# Patient Record
Sex: Female | Born: 1955
Health system: Southern US, Community
[De-identification: ages and names within clinical notes are randomized; demographics above are authoritative.]

## PROBLEM LIST (undated history)

## (undated) DIAGNOSIS — E785 Hyperlipidemia, unspecified: Secondary | ICD-10-CM

## (undated) HISTORY — PX: LAPAROSCOPIC TUBAL LIGATION: SUR803

## (undated) HISTORY — DX: Hyperlipidemia, unspecified: E78.5

## (undated) HISTORY — PX: HERNIA REPAIR: SHX51

---

## 1998-05-29 ENCOUNTER — Other Ambulatory Visit: Admission: RE | Admit: 1998-05-29 | Discharge: 1998-05-29 | Payer: Self-pay | Admitting: Obstetrics and Gynecology

## 1998-12-11 ENCOUNTER — Ambulatory Visit (HOSPITAL_COMMUNITY): Admission: RE | Admit: 1998-12-11 | Discharge: 1998-12-11 | Payer: Self-pay | Admitting: *Deleted

## 1999-10-21 ENCOUNTER — Other Ambulatory Visit: Admission: RE | Admit: 1999-10-21 | Discharge: 1999-10-21 | Payer: Self-pay | Admitting: Obstetrics and Gynecology

## 2000-12-15 ENCOUNTER — Other Ambulatory Visit: Admission: RE | Admit: 2000-12-15 | Discharge: 2000-12-15 | Payer: Self-pay | Admitting: Obstetrics and Gynecology

## 2002-02-08 ENCOUNTER — Other Ambulatory Visit: Admission: RE | Admit: 2002-02-08 | Discharge: 2002-02-08 | Payer: Self-pay | Admitting: Obstetrics and Gynecology

## 2003-03-29 ENCOUNTER — Other Ambulatory Visit: Admission: RE | Admit: 2003-03-29 | Discharge: 2003-03-29 | Payer: Self-pay | Admitting: Obstetrics and Gynecology

## 2004-03-27 ENCOUNTER — Other Ambulatory Visit: Admission: RE | Admit: 2004-03-27 | Discharge: 2004-03-27 | Payer: Self-pay | Admitting: Obstetrics and Gynecology

## 2005-04-01 ENCOUNTER — Other Ambulatory Visit: Admission: RE | Admit: 2005-04-01 | Discharge: 2005-04-01 | Payer: Self-pay | Admitting: Obstetrics and Gynecology

## 2006-07-02 ENCOUNTER — Ambulatory Visit (HOSPITAL_COMMUNITY): Admission: RE | Admit: 2006-07-02 | Discharge: 2006-07-02 | Payer: Self-pay | Admitting: Family Medicine

## 2008-08-08 ENCOUNTER — Emergency Department (HOSPITAL_COMMUNITY): Admission: EM | Admit: 2008-08-08 | Discharge: 2008-08-08 | Payer: Self-pay | Admitting: Emergency Medicine

## 2012-11-23 ENCOUNTER — Ambulatory Visit (INDEPENDENT_AMBULATORY_CARE_PROVIDER_SITE_OTHER): Payer: Commercial Managed Care - PPO | Admitting: General Surgery

## 2012-11-23 ENCOUNTER — Encounter (INDEPENDENT_AMBULATORY_CARE_PROVIDER_SITE_OTHER): Payer: Self-pay | Admitting: General Surgery

## 2012-11-23 VITALS — BP 118/68 | HR 64 | Temp 97.9°F | Resp 14 | Ht 65.0 in | Wt 136.4 lb

## 2012-11-23 DIAGNOSIS — K648 Other hemorrhoids: Secondary | ICD-10-CM | POA: Insufficient documentation

## 2012-11-23 MED ORDER — HYDROCORTISONE ACETATE 25 MG RE SUPP: 25.0000 mg | Freq: Two times a day (BID) | RECTAL | Status: DC

## 2012-11-23 MED ORDER — HYDROCORTISONE ACETATE 25 MG RE SUPP: 25.0000 mg | Freq: Every day | RECTAL | Status: DC

## 2012-11-23 NOTE — Patient Instructions (Signed)
HEMORRHOIDS    Did you know... Hemorrhoids are one of the most common ailments known.  More than half the population will develop hemorrhoids, usually after age 55.  Millions of Americans currently suffer from hemorrhoids.  The average person suffers in silence for a long period before seeking medical care.  Today's treatment methods make some types of hemorrhoid removal much less painful.  What are hemorrhoids? Often described as "varicose veins of the anus and rectum", hemorrhoids are enlarged, bulging blood vessels in and about the anus and lower rectum. There are two types of hemorrhoids: external and internal, which refer to their location.  External (outside) hemorrhoids develop near the anus and are covered by very sensitive skin. These are usually painless. However, if a blood clot (thrombosis) develops in an external hemorrhoid, it becomes a painful, hard lump. The external hemorrhoid may bleed if it ruptures. Internal (inside) hemorrhoids develop within the anus beneath the lining. Painless bleeding and protrusion during bowel movements are the most common symptom. However, an internal hemorrhoid can cause severe pain if it is completely "prolapsed" - protrudes from the anal opening and cannot be pushed back inside.   What causes hemorrhoids? An exact cause is unknown; however, the upright posture of humans alone forces a great deal of pressure on the rectal veins, which sometimes causes them to bulge. Other contributing factors include:  . Aging  . Chronic constipation or diarrhea  . Pregnancy  . Heredity  . Straining during bowel movements  . Faulty bowel function due to overuse of laxatives or enemas . Spending long periods of time (e.g., reading) on the toilet  Whatever the cause, the tissues supporting the vessels stretch. As a result, the vessels dilate; their walls become thin and bleed. If the stretching and pressure continue, the weakened vessels protrude.  What are the  symptoms? If you notice any of the following, you could have hemorrhoids:  . Bleeding during bowel movements  . Protrusion during bowel movements . Itching in the anal area  . Pain  . Sensitive lump(s)  How are hemorrhoids treated? Mild symptoms can be relieved frequently by increasing the amount of fiber (e.g., fruits, vegetables, breads and cereals) and fluids in the diet. Eliminating excessive straining reduces the pressure on hemorrhoids and helps prevent them from protruding. A sitz bath - sitting in plain warm water for about 10 minutes - can also provide some relief . With these measures, the pain and swelling of most symptomatic hemorrhoids will decrease in two to seven days, and the firm lump should recede within four to six weeks. In cases of severe or persistent pain from a thrombosed hemorrhoid, your physician may elect to remove the hemorrhoid containing the clot with a small incision. Performed under local anesthesia as an outpatient, this procedure generally provides relief. Severe hemorrhoids may require special treatment, much of which can be performed on an outpatient basis.  . Ligation - the rubber band treatment - works effectively on internal hemorrhoids that protrude with bowel movements. A small rubber band is placed over the hemorrhoid, cutting off its blood supply. The hemorrhoid and the band fall off in a few days and the wound usually heals in a week or two. This procedure sometimes produces mild discomfort and bleeding and may need to be repeated for a full effect.  There is a more intense version of this procedure that is done in the OR as outpatient surgery called THD.  It involves identifying blood vessels leading to the   hemorrhoids and then tying them off with sutures.  This method is a little more painful than rubber band ligation but less painful than traditional hemorrhoidectomy and usually does not have to be repeated.  It is best for internal hemorrhoids that  bleed.  Rubber Band Ligation of Internal Hemorrhoids:  A.  Bulging, bleeding, internal hemorrhoid B.  Rubber band applied at the base of the hemorrhoid C.  About 7 days later, the banded hemorrhoid has fallen off leaving a small scar (arrow)  . Injection and Coagulation can also be used on bleeding hemorrhoids that do not protrude. Both methods are relatively painless and cause the hemorrhoid to shrivel up. . Hemorrhoidectomy - surgery to remove the hemorrhoids - is the most complete method for removal of internal and external hemorrhoids. It is necessary when (1) clots repeatedly form in external hemorrhoids; (2) ligation fails to treat internal hemorrhoids; (3) the protruding hemorrhoid cannot be reduced; or (4) there is persistent bleeding. A hemorrhoidectomy removes excessive tissue that causes the bleeding and protrusion. It is done under anesthesia using sutures, and may, depending upon circumstances, require hospitalization and a period of inactivity. Laser hemorrhoidectomies do not offer any advantage over standard operative techniques. They are also quite expensive, and contrary to popular belief, are no less painful.  Do hemorrhoids lead to cancer? No. There is no relationship between hemorrhoids and cancer. However, the symptoms of hemorrhoids, particularly bleeding, are similar to those of colorectal cancer and other diseases of the digestive system. Therefore, it is important that all symptoms are investigated by a physician specially trained in treating diseases of the colon and rectum and that everyone 50 years or older undergo screening tests for colorectal cancer. Do not rely on over-the-counter medications or other self-treatments. See a colorectal surgeon first so your symptoms can be properly evaluated and effective treatment prescribed.  2012 American Society of Colon & Rectal Surgeons    Fiber Chart  You should 25-30g of fiber per day and drinking 8 glasses of water to help  your bowels move regularly.  In the chart below you can look up how much fiber you are getting in an average day.  If you are not getting enough fiber, you should add a fiber supplement to your diet.  Examples of this include Metamucil, FiberCon and Citrucel.  These can be purchased at your local grocery store or pharmacy.      http://www.[employer].edu/offices/health/nutritioncoach/AtoZ/handouts/Fiber.pdf    GETTING TO GOOD BOWEL HEALTH. Irregular bowel habits such as constipation can lead to many problems over time.  Having one soft bowel movement a day is the most important way to prevent further problems.  The anorectal canal is designed to handle stretching and feces to safely manage our ability to get rid of solid waste (feces, poop, stool) out of our body.  BUT, hard constipated stools can act like ripping concrete bricks causing inflamed hemorrhoids, anal fissures, abdominal pain and bloating.     The goal: ONE SOFT BOWEL MOVEMENT A DAY!  To have soft, regular bowel movements:    Drink at least 8 tall glasses of water a day.     Take plenty of fiber.  Fiber is the undigested part of plant food that passes into the colon, acting s "natures broom" to encourage bowel motility and movement.  Fiber can absorb and hold large amounts of water. This results in a larger, bulkier stool, which is soft and easier to pass. Work gradually over several weeks up to 6 servings a   day of fiber (25g a day even more if needed) in the form of: o Vegetables -- Root (potatoes, carrots, turnips), leafy green (lettuce, salad greens, celery, spinach), or cooked high residue (cabbage, broccoli, etc) o Fruit -- Fresh (unpeeled skin & pulp), Dried (prunes, apricots, cherries, etc ),  or stewed ( applesauce)  o Whole grain breads, pasta, etc (whole wheat)  o Bran cereals    Bulking Agents -- This type of water-retaining fiber generally is easily obtained each day by one of the following:  o Psyllium bran -- The psyllium  plant is remarkable because its ground seeds can retain so much water. This product is available as Metamucil, Konsyl, Effersyllium, Per Diem Fiber, or the less expensive generic preparation in drug and health food stores. Although labeled a laxative, it really is not a laxative.  o Methylcellulose -- This is another fiber derived from wood which also retains water. It is available as Citrucel. o Polyethylene Glycol - and "artificial" fiber commonly called Miralax or Glycolax.  It is helpful for people with gassy or bloated feelings with regular fiber o Flax Seed - a less gassy fiber than psyllium   No reading or other relaxing activity while on the toilet. If bowel movements take longer than 5 minutes, you are too constipated.   AVOID CONSTIPATION.  High fiber and water intake usually takes care of this.  Sometimes a laxative is needed to stimulate more frequent bowel movements, but    Laxatives are not a good long-term solution as it can wear the colon out. o Osmotics (Milk of Magnesia, Fleets phosphosoda, Magnesium citrate, MiraLax, GoLytely) are safer than  o Stimulants (Senokot, Castor Oil, Dulcolax, Ex Lax)    o Do not take laxatives for more than 7days in a row.    IF SEVERELY CONSTIPATED, try a Bowel Retraining Program: o Do not use laxatives.  o Eat a diet high in roughage, such as bran cereals and leafy vegetables.  o Drink six (6) ounces of prune or apricot juice each morning.  o Eat two (2) large servings of stewed fruit each day.  o Take one (1) heaping tablespoon of a psyllium-based bulking agent twice a day. Use sugar-free sweetener when possible to avoid excessive calories.  o Eat a normal breakfast.  o Set aside 15 minutes after breakfast to sit on the toilet, but do not strain to have a bowel movement.  o If you do not have a bowel movement by the third day, use an enema and repeat the above steps.    

## 2012-11-23 NOTE — Progress Notes (Signed)
Chief Complaint  Patient presents with  . New Evaluation    eval hems    HISTORY: Diamond Tucker is a 57 y.o. female who presents to the office with rectal bleeding about once a week.  Other symptoms include inability to clean the area.  This had been occurring for 6 months.  She has tried hemorrhoid creams in the past with little success.  Nothing makes the symptoms worse.   It is intermittent in nature.  Her bowel habits are regular and her bowel movements are soft.  She denies straining.  Her fiber intake is dietary.  Her last colonoscopy was about 3 yrs ago with Dr Ewing Schlein and was normal per pt.  She feels she has prolapsing tissue.   She did have a 4th degree tear with her last childbirth (9yrs ago).  Past Medical History  Diagnosis Date  . Hyperlipidemia       Past Surgical History  Procedure Laterality Date  . Hernia repair    . Laparoscopic tubal ligation          Current Outpatient Prescriptions  Medication Sig Dispense Refill  . lactobacillus acidophilus (BACID) TABS tablet Take 2 tablets by mouth 3 (three) times daily.      . Melatonin 3 MG TABS Take by mouth.      . Multiple Vitamins-Minerals (MULTIVITAMIN PO) Take by mouth.      . hydrocortisone (ANUSOL-HC) 25 MG suppository Place 1 suppository (25 mg total) rectally 2 (two) times daily.  12 suppository  0   No current facility-administered medications for this visit.      Not on File    History reviewed. No pertinent family history.  History   Social History  . Marital Status: Married    Spouse Name: N/A    Number of Children: N/A  . Years of Education: N/A   Social History Main Topics  . Smoking status: Never Smoker   . Smokeless tobacco: Never Used  . Alcohol Use: Yes     Comment: social/occ  . Drug Use: No  . Sexual Activity: None   Other Topics Concern  . None   Social History Narrative  . None      REVIEW OF SYSTEMS - PERTINENT POSITIVES ONLY: Review of Systems - General ROS: negative for -  chills, fever or weight loss Hematological and Lymphatic ROS: negative for - bleeding problems, blood clots or bruising Respiratory ROS: no cough, shortness of breath, or wheezing Cardiovascular ROS: no chest pain or dyspnea on exertion Gastrointestinal ROS: negative for - abdominal pain or diarrhea Genito-Urinary ROS: no dysuria, trouble voiding, or hematuria  EXAM: Filed Vitals:   11/23/12 1619  BP: 118/68  Pulse: 64  Temp: 97.9 F (36.6 C)  Resp: 14    General appearance: alert and cooperative Resp: clear to auscultation bilaterally Cardio: regular rate and rhythm GI: normal findings: soft, non-tender   Procedure: Anoscopy Surgeon: Maisie Fus Diagnosis: rectal bleeding  Assistant: Christella Scheuermann After the risks and benefits were explained, verbal consent was obtained for above procedure  Anesthesia: none Findings: good resting tone and squeeze pressure.  R ant internal and external hemorrhoid- grade 3, hard stool in rectal vault    ASSESSMENT AND PLAN: Diamond Tucker is a 57 y.o. F with recurrent rectal bleeding.  On exam she has a mildly inflamed grade 3 hemorrhoid.  I have asked her to start a fiber supplement daily and try some anusol suppositories at night for 7-10 days.  I will see her  back in 2 months.  She would be a good candidate for banding in the future, if she is still having problems then.      Vanita Panda, MD Colon and Rectal Surgery / General Surgery [facility]      Visit Diagnoses: 1. Internal bleeding hemorrhoids     Primary Care Physician: Thora Lance, MD

## 2013-01-26 ENCOUNTER — Encounter (INDEPENDENT_AMBULATORY_CARE_PROVIDER_SITE_OTHER): Payer: Commercial Managed Care - PPO | Admitting: General Surgery

## 2013-03-15 ENCOUNTER — Ambulatory Visit (INDEPENDENT_AMBULATORY_CARE_PROVIDER_SITE_OTHER): Payer: Commercial Managed Care - PPO | Admitting: General Surgery

## 2013-03-15 ENCOUNTER — Encounter (INDEPENDENT_AMBULATORY_CARE_PROVIDER_SITE_OTHER): Payer: Self-pay | Admitting: General Surgery

## 2013-03-15 VITALS — BP 122/70 | HR 72 | Ht 65.0 in | Wt 141.5 lb

## 2013-03-15 DIAGNOSIS — K642 Third degree hemorrhoids: Secondary | ICD-10-CM

## 2013-03-15 DIAGNOSIS — K648 Other hemorrhoids: Secondary | ICD-10-CM

## 2013-03-15 NOTE — Patient Instructions (Signed)
We will set up an apt to band your hemorrhoids.  Call the office if you change your mind or have any questions.

## 2013-03-15 NOTE — Progress Notes (Signed)
Diamond Tucker is a 58 y.o. female who is here for a follow up visit regarding her hemorrhoid disease.  SHe is having less symptoms but is still having issues every other week.  Objective: Filed Vitals:   03/15/13 1556  BP: 122/70  Pulse: 72    General appearance: alert and cooperative GI: normal findings: soft, non-tender Anal: moderate R A skin tag with grade 3 int hemorrhoid   Assessment and Plan: We discussed all of her options. These include continuing with medical therapy, banding in the office, hemorrhoidal pexy in the OR or hemorrhoid resection in the OR. We discussed that the recurrence rate is less than a more aggressive you are. She would like to start with a less aggressive treatment. We will set her up for banding in the office.  I discussed with her that she may continue to have issues after the banding in the may take a couple sessions.   Rosario Adie, Bow Mar Surgery, [place]

## 2013-04-10 ENCOUNTER — Ambulatory Visit (INDEPENDENT_AMBULATORY_CARE_PROVIDER_SITE_OTHER): Payer: Commercial Managed Care - PPO | Admitting: General Surgery

## 2013-04-10 ENCOUNTER — Encounter (INDEPENDENT_AMBULATORY_CARE_PROVIDER_SITE_OTHER): Payer: Self-pay | Admitting: General Surgery

## 2013-04-10 VITALS — BP 102/70 | HR 72 | Temp 98.4°F | Resp 12 | Ht 65.0 in | Wt 141.4 lb

## 2013-04-10 DIAGNOSIS — K648 Other hemorrhoids: Secondary | ICD-10-CM

## 2013-04-10 NOTE — Patient Instructions (Signed)
Patient Information following hemorrhoid banding  Hemorrhoid banding is a procedure that places a small rubber band around a hemorrhoid, causing it to clot and then break off and be passed in the stool.  This is a minor procedure, but problems may develop in rare cases.  The following are warning symptoms and signs that should alert you to a possible complication.  Please contact the office if any of these should occur:   Increased pain with bowel movements or sitting  Temperature over 100.4 F (oral)  Bleeding that is excessive (over one cup of clots or blood)  Redness or irritation outside the anus  Difficulty urinating  For your comfort please follow these instructions:   Maintain a high fiber diet so that your bowel movements will be soft  Take a fiber supplement twice a day (such as Metamucil, Benefiber, Citracel or Fibercon)  Sit in a tub of warm water 2-3 times a day for the first 2-3 days, as needed to soothe the area.  You may expect some pressure sensations in the anal area for 1-2 days  If you need pain medication, take Tylenol not aspirin or Ibuprofen products.  In 7-10 days, the banded tissue and rubber band will pass with your stool.  Occasionally there is some bleeding after this.  If this bleeding seems excessive, call the office immediately or go to the Emergency Room.    Make an appointment to see me in 2-3 weeks after the procedure 

## 2013-04-10 NOTE — Progress Notes (Signed)
Diamond Tucker is a 58 y.o. female who is here for a follow up visit regarding her hemorrhoid disease.  She is using a fiber supplement daily and Anusol suppositories as needed. She continues to have intermittent bleeding. She has a known grade 3 internal hemorrhoid. She is here today to discuss banding.  She denies any changes in her symptoms. She is having regular bowel movements.  Objective: Filed Vitals:   04/10/13 1042  BP: 102/70  Pulse: 72  Temp: 98.4 F (36.9 C)  Resp: 12    General appearance: alert and cooperative Resp: clear to auscultation bilaterally Cardio: regular rate and rhythm GI: normal findings: soft, non-tender  Procedure: Hemorrhoid banding ligation  Surgeon: Marcello Moores Assistant: Glaspey After the risks and benefits were explained, verbal consent was obtained for above procedure  Anesthesia: none Diagnosis: Bleeding, prolapsed internal hemorrhoid  The anatomy & physiology of the anorectal region was discussed.  The pathophysiology of hemorrhoids and differential diagnosis was discussed.  Natural history progression  was discussed.   I stressed the importance of a bowel regimen to have daily soft bowel movements to minimize progression of disease.     The patient's symptoms are not adequately controlled.  Therefore, I recommended banding to treat the hemorrhoids.  I went over the technique, risks, benefits, and alternatives.   Goals of post-operative recovery were discussed as well.  Questions were answered.  The patient expressed understanding & wished to proceed.  The patient was positioned in the prone position on the proctology table.  Perianal & rectal examination was done.  Using anoscopy, I ligated the hemorrhoids above the dentate line with banding in the anterior and R anterior positions.  The patient tolerated the procedure well.  Educational handouts further explaining the pathology, treatment options, and bowel regimen were given as well.   Assessment and  Plan: Diamond Tucker Is a 58 year old female who who is well known to me for a prolapsed internal hemorrhoid. She has done her fiber supplements and hemorrhoid creams and still continues to have intermittent bleeding. I have performed a banding in the office. I will see her back in 4 weeks for reevaluation and possible banding again if needed.    Rosario Adie, MD [facility], [place]

## 2013-05-15 ENCOUNTER — Encounter (INDEPENDENT_AMBULATORY_CARE_PROVIDER_SITE_OTHER): Payer: Self-pay | Admitting: General Surgery

## 2013-05-15 ENCOUNTER — Ambulatory Visit (INDEPENDENT_AMBULATORY_CARE_PROVIDER_SITE_OTHER): Payer: Commercial Managed Care - PPO | Admitting: General Surgery

## 2013-05-15 VITALS — BP 92/60 | HR 80 | Temp 98.2°F | Resp 16 | Ht 65.0 in | Wt 143.2 lb

## 2013-05-15 DIAGNOSIS — K648 Other hemorrhoids: Secondary | ICD-10-CM

## 2013-05-15 NOTE — Patient Instructions (Signed)
Return to the office in 6 weeks 

## 2013-05-15 NOTE — Progress Notes (Signed)
Diamond Tucker is a 58 y.o. female who is here for a follow up visit regarding Her bleeding internal hemorrhoids. She reports one episode of bleeding with bowel movements. She also has some occasional blood on the toilet paper. She had stone well with her banding so far.  Objective: Filed Vitals:   05/15/13 1625  BP: 92/60  Pulse: 80  Temp: 98.2 F (36.8 C)  Resp: 16    General appearance: alert and cooperative GI: normal findings: soft, non-tender perianal: Small skin tags noted and right anterior and left lateral positions. No signs of inflammation. Digital rectal exam reveals a healing areas of previous banding.  Assessment and Plan: I have recommended that she continue her high fiber diet and monitor these over the next 6 weeks. If she continues to have bleeding that does not get better over this time period, I think it would be reasonable to repeat the banding in the office.    Rosario Adie, MD [facility], [place]

## 2013-06-26 ENCOUNTER — Encounter (INDEPENDENT_AMBULATORY_CARE_PROVIDER_SITE_OTHER): Payer: Commercial Managed Care - PPO | Admitting: General Surgery

## 2013-06-29 ENCOUNTER — Encounter (INDEPENDENT_AMBULATORY_CARE_PROVIDER_SITE_OTHER): Payer: Commercial Managed Care - PPO | Admitting: General Surgery

## 2013-08-02 ENCOUNTER — Ambulatory Visit: Payer: Self-pay

## 2013-08-02 ENCOUNTER — Other Ambulatory Visit: Payer: Self-pay | Admitting: Occupational Medicine

## 2013-08-02 DIAGNOSIS — M25562 Pain in left knee: Secondary | ICD-10-CM

## 2013-08-16 ENCOUNTER — Encounter (INDEPENDENT_AMBULATORY_CARE_PROVIDER_SITE_OTHER): Payer: Commercial Managed Care - PPO | Admitting: General Surgery

## 2015-04-04 DIAGNOSIS — E7800 Pure hypercholesterolemia, unspecified: Secondary | ICD-10-CM | POA: Diagnosis not present

## 2015-04-04 DIAGNOSIS — Z0000 Encounter for general adult medical examination without abnormal findings: Secondary | ICD-10-CM | POA: Diagnosis not present

## 2015-04-04 MED FILL — FENOFIBRATE 160 MG TABLET: 160 | 90 days supply | Qty: 90 | Fill #1

## 2015-04-18 DIAGNOSIS — L814 Other melanin hyperpigmentation: Secondary | ICD-10-CM | POA: Diagnosis not present

## 2015-04-18 DIAGNOSIS — D2261 Melanocytic nevi of right upper limb, including shoulder: Secondary | ICD-10-CM | POA: Diagnosis not present

## 2015-04-18 DIAGNOSIS — D2272 Melanocytic nevi of left lower limb, including hip: Secondary | ICD-10-CM | POA: Diagnosis not present

## 2015-04-18 DIAGNOSIS — D225 Melanocytic nevi of trunk: Secondary | ICD-10-CM | POA: Diagnosis not present

## 2015-04-18 DIAGNOSIS — D2262 Melanocytic nevi of left upper limb, including shoulder: Secondary | ICD-10-CM | POA: Diagnosis not present

## 2015-07-08 MED FILL — FENOFIBRATE 160 MG TABLET: 160 | 90 days supply | Qty: 90 | Fill #0

## 2015-10-11 DIAGNOSIS — Z1231 Encounter for screening mammogram for malignant neoplasm of breast: Secondary | ICD-10-CM | POA: Diagnosis not present

## 2015-10-11 DIAGNOSIS — Z01419 Encounter for gynecological examination (general) (routine) without abnormal findings: Secondary | ICD-10-CM | POA: Diagnosis not present

## 2015-10-11 DIAGNOSIS — Z6822 Body mass index (BMI) 22.0-22.9, adult: Secondary | ICD-10-CM | POA: Diagnosis not present

## 2015-10-11 DIAGNOSIS — Z124 Encounter for screening for malignant neoplasm of cervix: Secondary | ICD-10-CM | POA: Diagnosis not present

## 2015-10-16 MED FILL — FENOFIBRATE 160 MG TABLET: 160 | 90 days supply | Qty: 90 | Fill #1

## 2015-10-29 DIAGNOSIS — H524 Presbyopia: Secondary | ICD-10-CM | POA: Diagnosis not present

## 2015-10-29 DIAGNOSIS — H35371 Puckering of macula, right eye: Secondary | ICD-10-CM | POA: Diagnosis not present

## 2015-12-20 MED FILL — ALPRAZolam 0.5 MG TABS: 0.5 | 10 days supply | Qty: 30 | Fill #0

## 2016-01-20 MED FILL — FENOFIBRATE 160 MG TABLET: 160 | 90 days supply | Qty: 90 | Fill #2

## 2016-02-04 DIAGNOSIS — H35371 Puckering of macula, right eye: Secondary | ICD-10-CM | POA: Diagnosis not present

## 2016-02-18 DIAGNOSIS — L821 Other seborrheic keratosis: Secondary | ICD-10-CM | POA: Diagnosis not present

## 2016-02-18 DIAGNOSIS — L820 Inflamed seborrheic keratosis: Secondary | ICD-10-CM | POA: Diagnosis not present

## 2016-04-22 MED FILL — FENOFIBRATE 160 MG TABLET: 160 | 90 days supply | Qty: 90 | Fill #0

## 2016-06-24 DIAGNOSIS — D485 Neoplasm of uncertain behavior of skin: Secondary | ICD-10-CM | POA: Diagnosis not present

## 2016-06-24 DIAGNOSIS — D225 Melanocytic nevi of trunk: Secondary | ICD-10-CM | POA: Diagnosis not present

## 2016-06-24 DIAGNOSIS — Z0000 Encounter for general adult medical examination without abnormal findings: Secondary | ICD-10-CM | POA: Diagnosis not present

## 2016-06-24 DIAGNOSIS — E7800 Pure hypercholesterolemia, unspecified: Secondary | ICD-10-CM | POA: Diagnosis not present

## 2016-07-17 MED FILL — ALPRAZolam 0.5 MG TABS: 0.5 | 10 days supply | Qty: 30 | Fill #0

## 2016-07-20 MED FILL — FENOFIBRATE 160 MG TABLET: 160 | 90 days supply | Qty: 90 | Fill #0

## 2016-10-22 MED FILL — FENOFIBRATE 160 MG TABLET: 160 | 90 days supply | Qty: 90 | Fill #1

## 2016-10-26 DIAGNOSIS — Z1231 Encounter for screening mammogram for malignant neoplasm of breast: Secondary | ICD-10-CM | POA: Diagnosis not present

## 2016-10-26 DIAGNOSIS — Z124 Encounter for screening for malignant neoplasm of cervix: Secondary | ICD-10-CM | POA: Diagnosis not present

## 2016-10-26 DIAGNOSIS — Z01419 Encounter for gynecological examination (general) (routine) without abnormal findings: Secondary | ICD-10-CM | POA: Diagnosis not present

## 2016-10-29 DIAGNOSIS — R300 Dysuria: Secondary | ICD-10-CM | POA: Diagnosis not present

## 2016-10-29 MED FILL — CIPROFLOXACIN HCL 500 MG TA: 500 | 3 days supply | Qty: 6 | Fill #0

## 2017-01-19 MED FILL — FENOFIBRATE 160 MG TABLET: 160 | 90 days supply | Qty: 90 | Fill #2

## 2017-03-29 DIAGNOSIS — H35371 Puckering of macula, right eye: Secondary | ICD-10-CM | POA: Diagnosis not present

## 2017-03-29 DIAGNOSIS — H524 Presbyopia: Secondary | ICD-10-CM | POA: Diagnosis not present

## 2017-04-26 MED FILL — FENOFIBRATE 160 MG TABLET: 160 | 90 days supply | Qty: 90 | Fill #3

## 2017-07-20 MED FILL — FENOFIBRATE 160 MG TABLET: 160 | 90 days supply | Qty: 90 | Fill #0

## 2017-09-20 MED FILL — ALPRAZolam 0.5 MG TABS: 0.5 | 10 days supply | Qty: 30 | Fill #0

## 2017-10-08 DIAGNOSIS — Z0000 Encounter for general adult medical examination without abnormal findings: Secondary | ICD-10-CM | POA: Diagnosis not present

## 2017-10-08 DIAGNOSIS — R0981 Nasal congestion: Secondary | ICD-10-CM | POA: Diagnosis not present

## 2017-10-08 DIAGNOSIS — E7800 Pure hypercholesterolemia, unspecified: Secondary | ICD-10-CM | POA: Diagnosis not present

## 2017-10-08 DIAGNOSIS — R748 Abnormal levels of other serum enzymes: Secondary | ICD-10-CM | POA: Diagnosis not present

## 2017-10-08 DIAGNOSIS — Z23 Encounter for immunization: Secondary | ICD-10-CM | POA: Diagnosis not present

## 2017-10-08 MED FILL — AZELASTINE HCL 137 MCG SPRY: 0.1 | 30 days supply | Qty: 30 | Fill #0

## 2017-10-08 MED FILL — FENOFIBRATE 160 MG TABLET: 160 | 90 days supply | Qty: 90 | Fill #0

## 2017-11-19 DIAGNOSIS — Z124 Encounter for screening for malignant neoplasm of cervix: Secondary | ICD-10-CM | POA: Diagnosis not present

## 2017-11-19 DIAGNOSIS — Z01419 Encounter for gynecological examination (general) (routine) without abnormal findings: Secondary | ICD-10-CM | POA: Diagnosis not present

## 2017-11-19 DIAGNOSIS — Z1231 Encounter for screening mammogram for malignant neoplasm of breast: Secondary | ICD-10-CM | POA: Diagnosis not present

## 2017-11-22 ENCOUNTER — Other Ambulatory Visit: Payer: Self-pay | Admitting: Obstetrics and Gynecology

## 2017-11-22 DIAGNOSIS — R928 Other abnormal and inconclusive findings on diagnostic imaging of breast: Secondary | ICD-10-CM

## 2017-11-26 ENCOUNTER — Ambulatory Visit
Admission: RE | Admit: 2017-11-26 | Discharge: 2017-11-26 | Disposition: A | Discharge status: Home / Self Care | Payer: 59 | Source: Ambulatory Visit | Attending: Obstetrics and Gynecology | Admitting: Obstetrics and Gynecology

## 2017-11-26 ENCOUNTER — Other Ambulatory Visit: Payer: Self-pay | Admitting: Obstetrics and Gynecology

## 2017-11-26 DIAGNOSIS — N6489 Other specified disorders of breast: Secondary | ICD-10-CM | POA: Diagnosis not present

## 2017-11-26 DIAGNOSIS — R928 Other abnormal and inconclusive findings on diagnostic imaging of breast: Secondary | ICD-10-CM

## 2017-12-01 ENCOUNTER — Other Ambulatory Visit: Payer: Self-pay | Admitting: Obstetrics and Gynecology

## 2017-12-01 DIAGNOSIS — R922 Inconclusive mammogram: Secondary | ICD-10-CM

## 2017-12-01 DIAGNOSIS — R9230 Dense breasts, unspecified: Secondary | ICD-10-CM

## 2018-01-18 MED FILL — FENOFIBRATE 160 MG TABLET: 160 | 90 days supply | Qty: 90 | Fill #1

## 2018-04-11 DIAGNOSIS — H35371 Puckering of macula, right eye: Secondary | ICD-10-CM | POA: Diagnosis not present

## 2018-04-11 DIAGNOSIS — H524 Presbyopia: Secondary | ICD-10-CM | POA: Diagnosis not present

## 2018-04-18 MED FILL — FENOFIBRATE 160 MG TABLET: 160 | 90 days supply | Qty: 90 | Fill #2

## 2018-05-23 DIAGNOSIS — H35371 Puckering of macula, right eye: Secondary | ICD-10-CM | POA: Diagnosis not present

## 2018-06-02 ENCOUNTER — Other Ambulatory Visit: Payer: Self-pay

## 2018-06-02 ENCOUNTER — Ambulatory Visit
Admission: RE | Admit: 2018-06-02 | Discharge: 2018-06-02 | Disposition: A | Discharge status: Home / Self Care | Payer: 59 | Source: Ambulatory Visit | Attending: Obstetrics and Gynecology | Admitting: Obstetrics and Gynecology

## 2018-06-02 DIAGNOSIS — N6310 Unspecified lump in the right breast, unspecified quadrant: Secondary | ICD-10-CM | POA: Diagnosis not present

## 2018-06-02 DIAGNOSIS — R922 Inconclusive mammogram: Secondary | ICD-10-CM

## 2018-06-03 ENCOUNTER — Other Ambulatory Visit: Payer: Self-pay

## 2018-07-18 MED FILL — FENOFIBRATE 160 MG TABLET: 160 | 90 days supply | Qty: 90 | Fill #0

## 2018-09-23 MED FILL — ALPRAZolam 0.5 MG TABS: 0.5 | 10 days supply | Qty: 30 | Fill #0

## 2018-10-10 DIAGNOSIS — R748 Abnormal levels of other serum enzymes: Secondary | ICD-10-CM | POA: Diagnosis not present

## 2018-10-10 DIAGNOSIS — Z0000 Encounter for general adult medical examination without abnormal findings: Secondary | ICD-10-CM | POA: Diagnosis not present

## 2018-10-10 DIAGNOSIS — E7800 Pure hypercholesterolemia, unspecified: Secondary | ICD-10-CM | POA: Diagnosis not present

## 2018-10-10 MED FILL — FENOFIBRATE 160 MG TABLET: 160 | 90 days supply | Qty: 90 | Fill #0

## 2018-10-13 DIAGNOSIS — R748 Abnormal levels of other serum enzymes: Secondary | ICD-10-CM | POA: Diagnosis not present

## 2018-10-13 DIAGNOSIS — E7800 Pure hypercholesterolemia, unspecified: Secondary | ICD-10-CM | POA: Diagnosis not present

## 2018-11-28 DIAGNOSIS — Z124 Encounter for screening for malignant neoplasm of cervix: Secondary | ICD-10-CM | POA: Diagnosis not present

## 2018-11-28 DIAGNOSIS — Z01419 Encounter for gynecological examination (general) (routine) without abnormal findings: Secondary | ICD-10-CM | POA: Diagnosis not present

## 2018-12-23 DIAGNOSIS — Z1231 Encounter for screening mammogram for malignant neoplasm of breast: Secondary | ICD-10-CM | POA: Diagnosis not present

## 2019-01-17 MED FILL — FENOFIBRATE 160 MG TABLET: 160 | 90 days supply | Qty: 90 | Fill #1

## 2019-04-17 MED FILL — FENOFIBRATE 160 MG TABLET: 160 | 90 days supply | Qty: 90 | Fill #2

## 2019-08-09 DIAGNOSIS — R194 Change in bowel habit: Secondary | ICD-10-CM | POA: Diagnosis not present

## 2019-08-09 DIAGNOSIS — R142 Eructation: Secondary | ICD-10-CM | POA: Diagnosis not present

## 2019-08-09 DIAGNOSIS — R109 Unspecified abdominal pain: Secondary | ICD-10-CM | POA: Diagnosis not present

## 2019-08-09 MED FILL — OSCIMIN SR 0.375 MG TABLET: 0.375 | 30 days supply | Qty: 60 | Fill #0

## 2019-08-09 MED FILL — OMEPRAZOLE DR 20 MG CAPSULE: 20 | 30 days supply | Qty: 30 | Fill #0

## 2019-09-12 MED FILL — ALPRAZolam 0.5 MG TABS: 0.5 | 10 days supply | Qty: 30 | Fill #1

## 2019-10-03 MED FILL — NULYTELY LEMON-LI SOL: 1 days supply | Qty: 4000 | Fill #0

## 2019-10-11 ENCOUNTER — Other Ambulatory Visit (HOSPITAL_COMMUNITY): Payer: Self-pay | Admitting: Family Medicine

## 2019-10-11 MED FILL — FENOFIBRATE 160 MG TABLET: 160 | 90 days supply | Qty: 90 | Fill #0

## 2019-10-24 DIAGNOSIS — K5730 Diverticulosis of large intestine without perforation or abscess without bleeding: Secondary | ICD-10-CM | POA: Diagnosis not present

## 2019-10-24 DIAGNOSIS — D123 Benign neoplasm of transverse colon: Secondary | ICD-10-CM | POA: Diagnosis not present

## 2019-10-24 DIAGNOSIS — Z1211 Encounter for screening for malignant neoplasm of colon: Secondary | ICD-10-CM | POA: Diagnosis not present

## 2019-11-15 ENCOUNTER — Other Ambulatory Visit (HOSPITAL_COMMUNITY): Payer: Self-pay | Admitting: Family Medicine

## 2019-11-15 DIAGNOSIS — Z0000 Encounter for general adult medical examination without abnormal findings: Secondary | ICD-10-CM | POA: Diagnosis not present

## 2019-11-15 DIAGNOSIS — E7800 Pure hypercholesterolemia, unspecified: Secondary | ICD-10-CM | POA: Diagnosis not present

## 2019-11-15 DIAGNOSIS — R748 Abnormal levels of other serum enzymes: Secondary | ICD-10-CM | POA: Diagnosis not present

## 2019-11-16 IMAGING — MG DIGITAL DIAGNOSTIC UNILATERAL RIGHT MAMMOGRAM WITH TOMO AND CAD
4 series · 4 of 12 positions shown · non-contrast
Comparison: Previous exam(s).

ACR Breast Density Category a: The breast tissue is almost entirely
fatty.

CLINICAL DATA: Six-month follow-up of right breast mass.

EXAM:
DIGITAL DIAGNOSTIC RIGHT MAMMOGRAM WITH CAD AND TOMO
ULTRASOUND RIGHT BREAST

[R CC synth-2D]
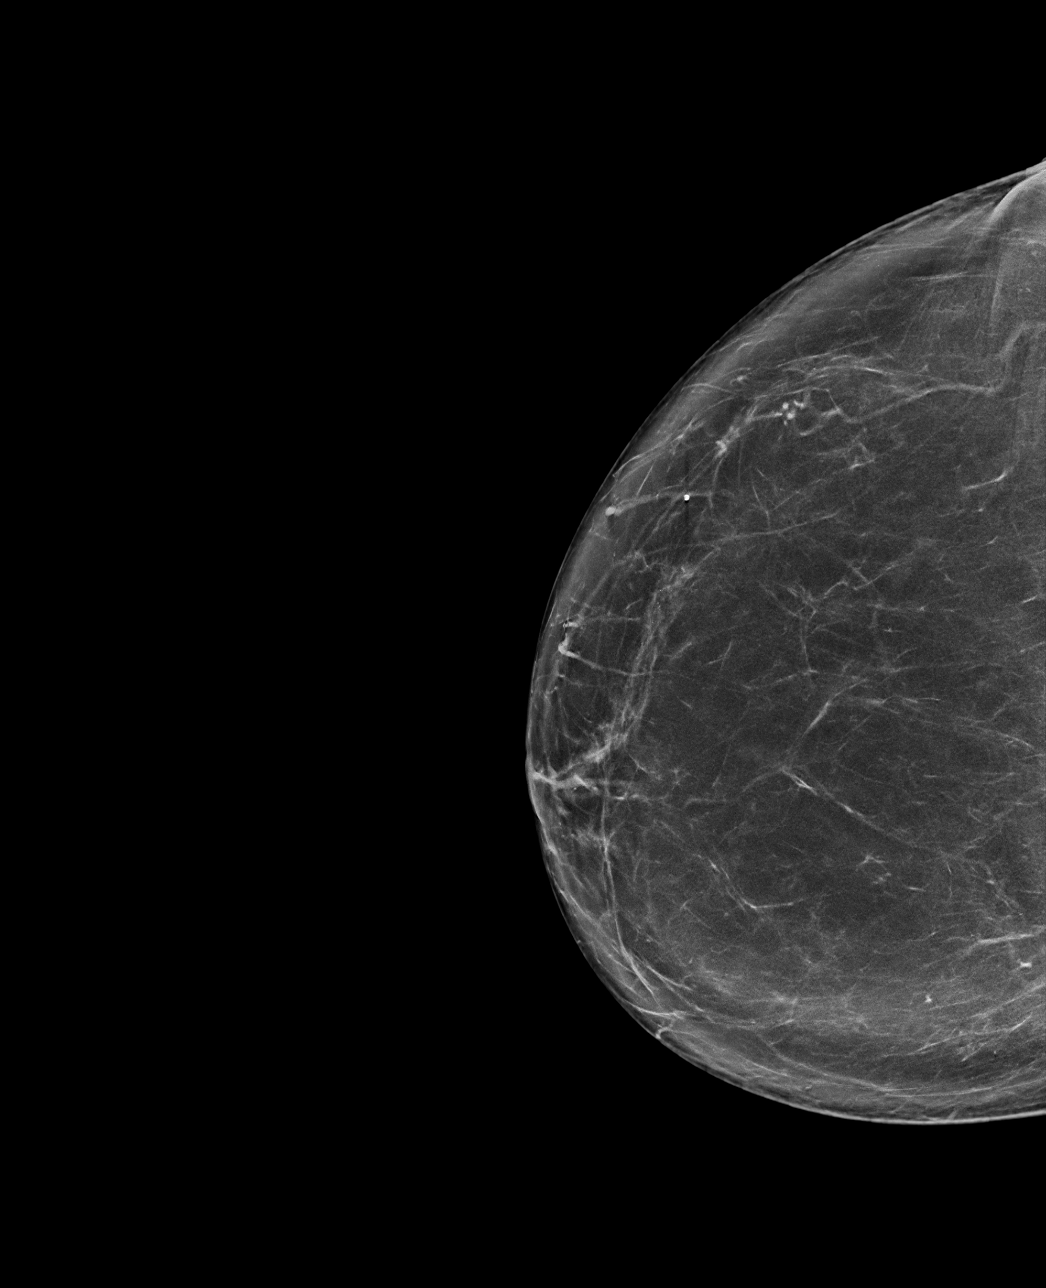

[R MLO synth-2D]
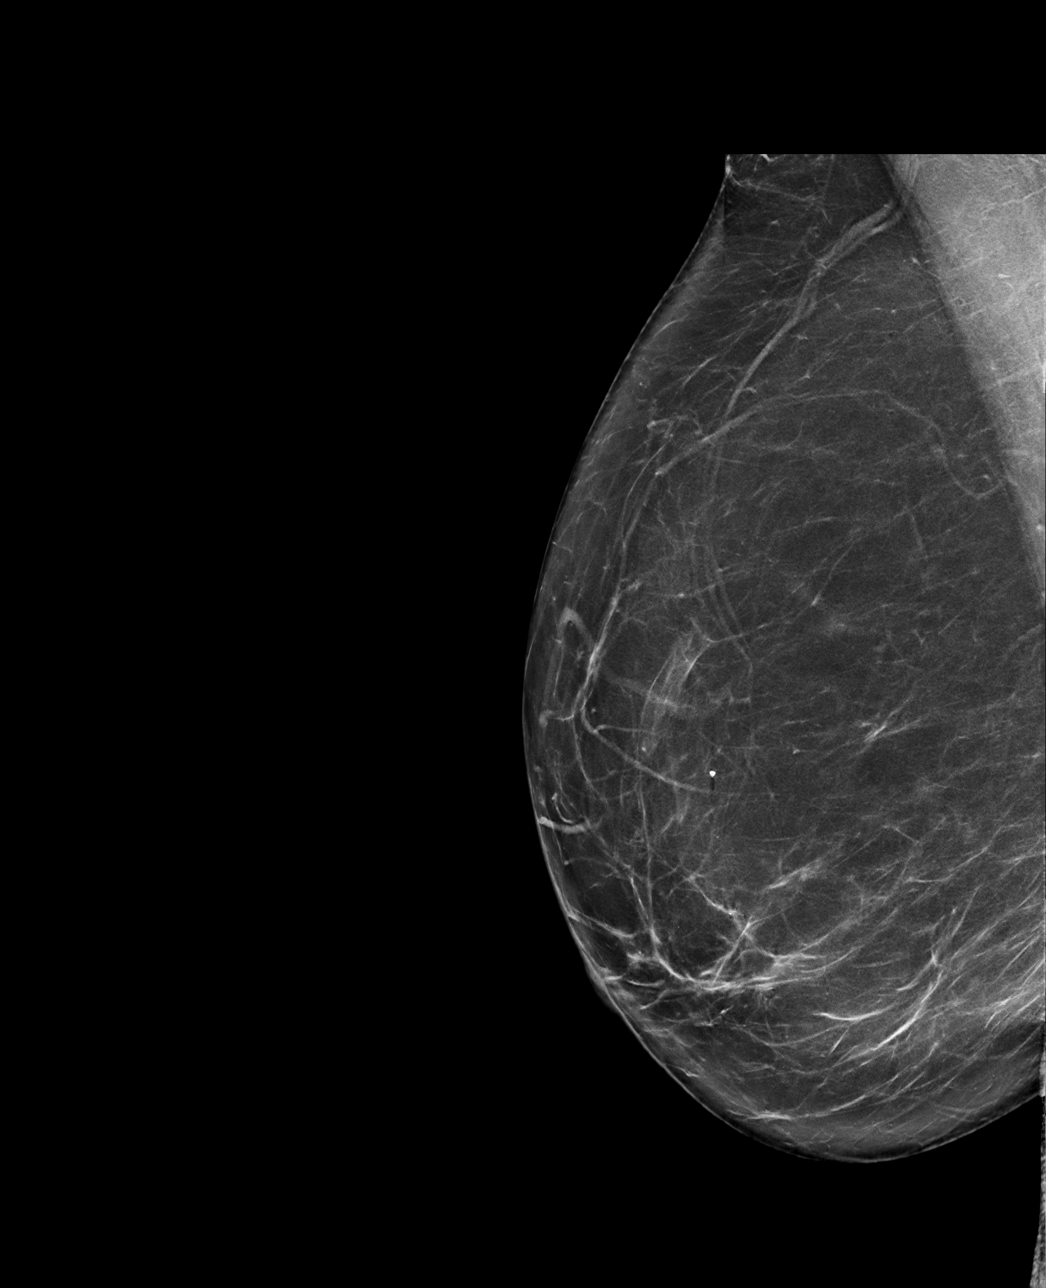

[R MLO tomo · tomo slice 43/84.0]
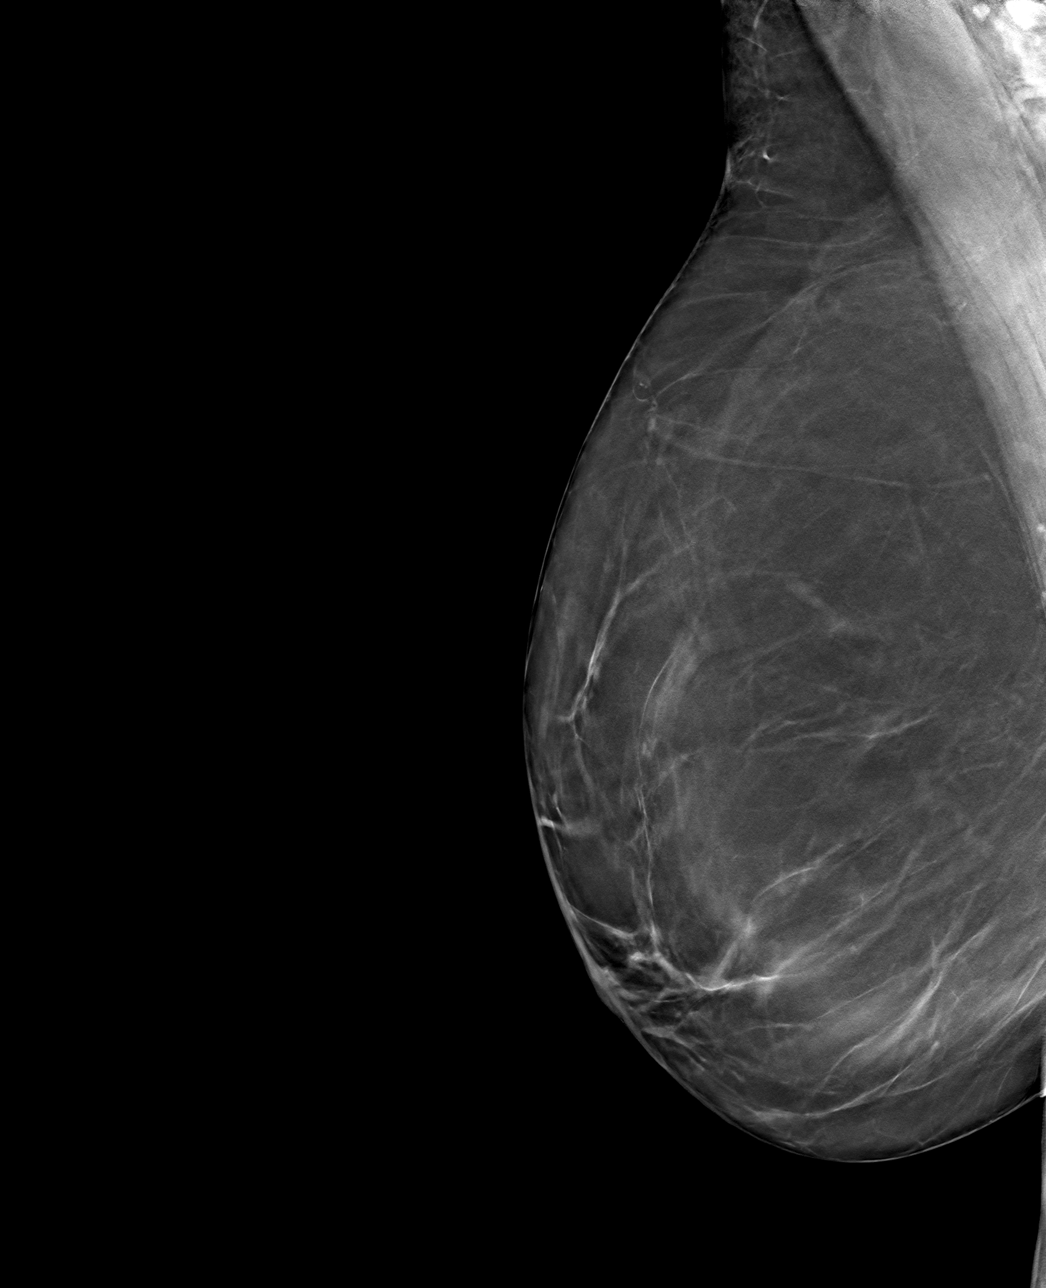

[R CC tomo · tomo slice 43/84.0]
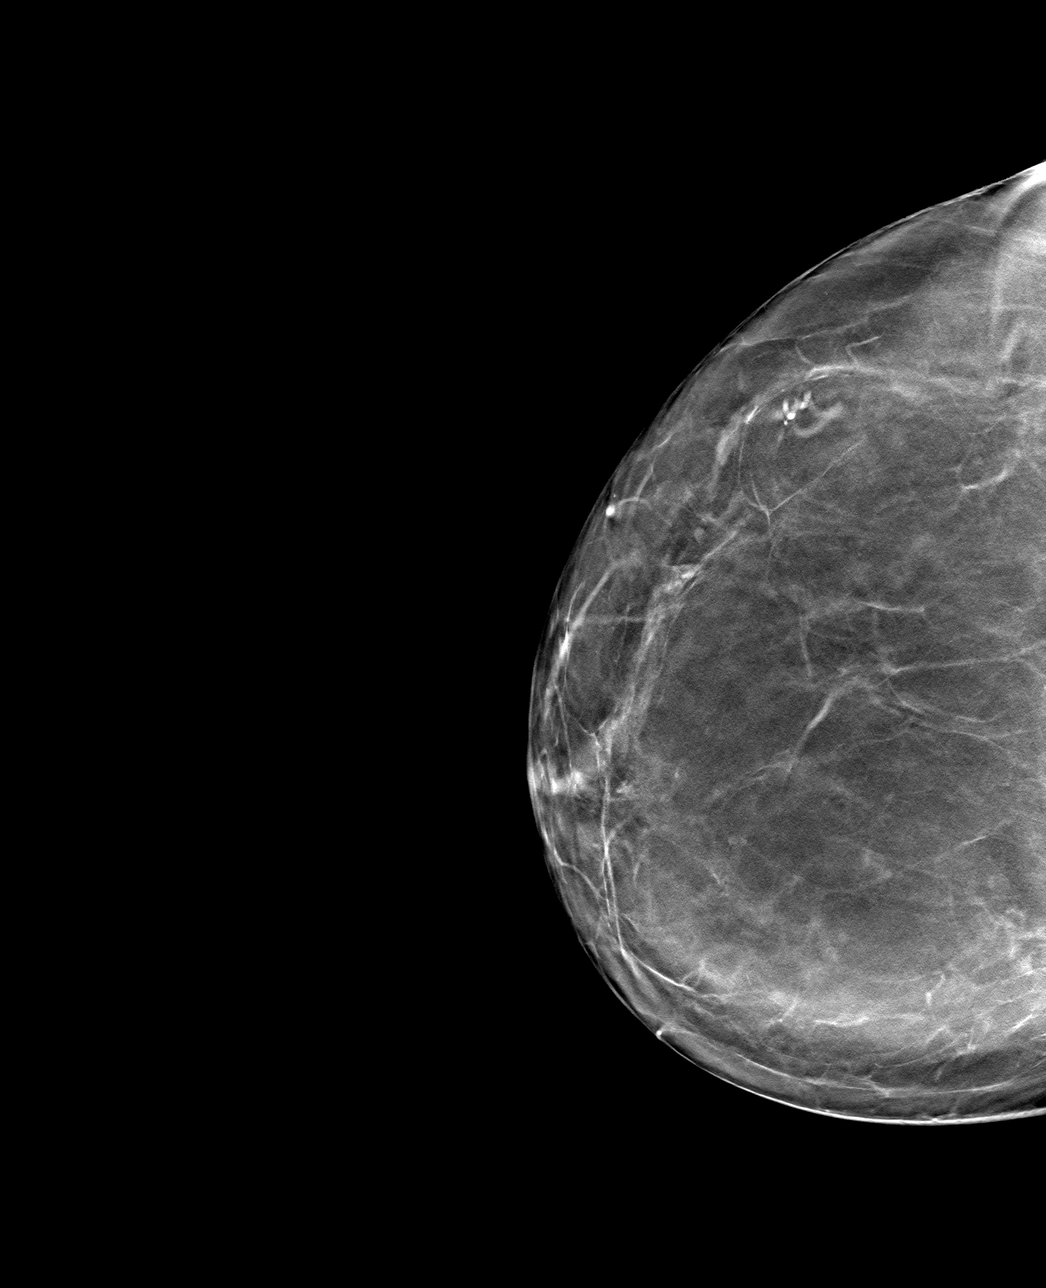

[4 of 12 positions shown; findings below may reference images not displayed]

FINDINGS: The mass in the medial right breast has resolved. No suspicious
mammographic findings on the right.

Mammographic images were processed with CAD.

On physical exam, no suspicious lumps are identified.

Targeted ultrasound is performed, showing resolution of the
previously identified hypoechoic mass in the right breast at 2
o'clock.
IMPRESSION: Resolution of the mammographically and sonographically identified
masses. No evidence of malignancy on the right.

RECOMMENDATION:
Annual screening mammography in Thursday November, 2018.

I have discussed the findings and recommendations with the patient.
Results were also provided in writing at the conclusion of the
visit. If applicable, a reminder letter will be sent to the patient
regarding the next appointment.

BI-RADS CATEGORY  1: Negative.

## 2019-12-26 ENCOUNTER — Other Ambulatory Visit (HOSPITAL_COMMUNITY): Payer: Self-pay | Admitting: Obstetrics and Gynecology

## 2019-12-26 DIAGNOSIS — Z1231 Encounter for screening mammogram for malignant neoplasm of breast: Secondary | ICD-10-CM | POA: Diagnosis not present

## 2019-12-26 DIAGNOSIS — Z01419 Encounter for gynecological examination (general) (routine) without abnormal findings: Secondary | ICD-10-CM | POA: Diagnosis not present

## 2020-01-15 MED FILL — FENOFIBRATE 160 MG TABLET: 160 | 90 days supply | Qty: 90 | Fill #1

## 2020-02-22 DIAGNOSIS — R42 Dizziness and giddiness: Secondary | ICD-10-CM | POA: Diagnosis not present

## 2020-03-07 DIAGNOSIS — M79672 Pain in left foot: Secondary | ICD-10-CM | POA: Diagnosis not present

## 2020-03-15 DIAGNOSIS — Z23 Encounter for immunization: Secondary | ICD-10-CM | POA: Diagnosis not present

## 2020-04-12 MED FILL — FENOFIBRATE 160 MG TABLET: 160 | 90 days supply | Qty: 90 | Fill #0

## 2020-04-15 MED FILL — ALPRAZolam 0.5 MG TABS: 0.5 | 10 days supply | Qty: 30 | Fill #0

## 2020-05-20 DIAGNOSIS — H35371 Puckering of macula, right eye: Secondary | ICD-10-CM | POA: Diagnosis not present

## 2020-05-20 DIAGNOSIS — D3122 Benign neoplasm of left retina: Secondary | ICD-10-CM | POA: Diagnosis not present

## 2020-05-20 DIAGNOSIS — H524 Presbyopia: Secondary | ICD-10-CM | POA: Diagnosis not present

## 2020-06-10 DIAGNOSIS — H02834 Dermatochalasis of left upper eyelid: Secondary | ICD-10-CM | POA: Diagnosis not present

## 2020-06-10 DIAGNOSIS — H57813 Brow ptosis, bilateral: Secondary | ICD-10-CM | POA: Diagnosis not present

## 2020-06-10 DIAGNOSIS — H02835 Dermatochalasis of left lower eyelid: Secondary | ICD-10-CM | POA: Diagnosis not present

## 2020-06-10 DIAGNOSIS — H53483 Generalized contraction of visual field, bilateral: Secondary | ICD-10-CM | POA: Diagnosis not present

## 2020-06-10 DIAGNOSIS — H0279 Other degenerative disorders of eyelid and periocular area: Secondary | ICD-10-CM | POA: Diagnosis not present

## 2020-06-10 DIAGNOSIS — H02423 Myogenic ptosis of bilateral eyelids: Secondary | ICD-10-CM | POA: Diagnosis not present

## 2020-06-10 DIAGNOSIS — H02832 Dermatochalasis of right lower eyelid: Secondary | ICD-10-CM | POA: Diagnosis not present

## 2020-06-10 DIAGNOSIS — H02413 Mechanical ptosis of bilateral eyelids: Secondary | ICD-10-CM | POA: Diagnosis not present

## 2020-06-10 DIAGNOSIS — H02831 Dermatochalasis of right upper eyelid: Secondary | ICD-10-CM | POA: Diagnosis not present

## 2020-06-17 ENCOUNTER — Other Ambulatory Visit (HOSPITAL_COMMUNITY): Payer: Self-pay

## 2020-06-17 DIAGNOSIS — R300 Dysuria: Secondary | ICD-10-CM | POA: Diagnosis not present

## 2020-06-17 MED ORDER — SULFAMETHOXAZOLE-TRIMETHOPRIM 800-160 MG PO TABS
1.0000 | ORAL_TABLET | Freq: Two times a day (BID) | ORAL | 0 refills | Status: DC
  Filled 2020-06-17: qty 6, 3d supply, fill #0

## 2020-06-24 DIAGNOSIS — Z23 Encounter for immunization: Secondary | ICD-10-CM | POA: Diagnosis not present

## 2020-07-07 MED FILL — Fenofibrate Tab 160 MG: ORAL | 90 days supply | Qty: 90 | Fill #0 | Status: AC

## 2020-07-08 ENCOUNTER — Other Ambulatory Visit (HOSPITAL_COMMUNITY): Payer: Self-pay

## 2020-07-08 DIAGNOSIS — H53483 Generalized contraction of visual field, bilateral: Secondary | ICD-10-CM | POA: Diagnosis not present

## 2020-07-08 DIAGNOSIS — H53482 Generalized contraction of visual field, left eye: Secondary | ICD-10-CM | POA: Diagnosis not present

## 2020-07-08 DIAGNOSIS — H53481 Generalized contraction of visual field, right eye: Secondary | ICD-10-CM | POA: Diagnosis not present

## 2020-10-07 MED FILL — Fenofibrate Tab 160 MG: ORAL | 90 days supply | Qty: 90 | Fill #1 | Status: AC

## 2020-10-08 ENCOUNTER — Other Ambulatory Visit (HOSPITAL_COMMUNITY): Payer: Self-pay

## 2020-10-09 ENCOUNTER — Other Ambulatory Visit (HOSPITAL_COMMUNITY): Payer: Self-pay

## 2020-10-10 ENCOUNTER — Other Ambulatory Visit (HOSPITAL_COMMUNITY): Payer: Self-pay

## 2020-10-11 ENCOUNTER — Other Ambulatory Visit (HOSPITAL_COMMUNITY): Payer: Self-pay

## 2020-10-14 ENCOUNTER — Other Ambulatory Visit (HOSPITAL_COMMUNITY): Payer: Self-pay

## 2020-10-15 ENCOUNTER — Other Ambulatory Visit (HOSPITAL_COMMUNITY): Payer: Self-pay

## 2020-11-19 ENCOUNTER — Other Ambulatory Visit (HOSPITAL_COMMUNITY): Payer: Self-pay

## 2020-11-19 DIAGNOSIS — Z789 Other specified health status: Secondary | ICD-10-CM | POA: Diagnosis not present

## 2020-11-19 DIAGNOSIS — Z0000 Encounter for general adult medical examination without abnormal findings: Secondary | ICD-10-CM | POA: Diagnosis not present

## 2020-11-19 DIAGNOSIS — R748 Abnormal levels of other serum enzymes: Secondary | ICD-10-CM | POA: Diagnosis not present

## 2020-11-19 DIAGNOSIS — E7800 Pure hypercholesterolemia, unspecified: Secondary | ICD-10-CM | POA: Diagnosis not present

## 2020-11-19 MED ORDER — FENOFIBRATE 160 MG PO TABS
160.0000 mg | ORAL_TABLET | Freq: Every day | ORAL | 4 refills | Status: DC
  Filled 2020-11-19 – 2021-01-06 (×2): qty 90, 90d supply, fill #0
  Filled 2021-04-04: qty 90, 90d supply, fill #1
  Filled 2021-07-04: qty 90, 90d supply, fill #2
  Filled 2021-09-24: qty 90, 90d supply, fill #3

## 2020-11-22 ENCOUNTER — Other Ambulatory Visit (HOSPITAL_COMMUNITY): Payer: Self-pay

## 2020-11-22 MED ORDER — NEOMYCIN-POLYMYXIN-DEXAMETH 3.5-10000-0.1 OP OINT
TOPICAL_OINTMENT | OPHTHALMIC | 0 refills | Status: DC
  Filled 2020-11-22: qty 3.5, 7d supply, fill #0

## 2020-11-29 ENCOUNTER — Other Ambulatory Visit (HOSPITAL_COMMUNITY): Payer: Self-pay

## 2020-11-29 DIAGNOSIS — H02834 Dermatochalasis of left upper eyelid: Secondary | ICD-10-CM | POA: Diagnosis not present

## 2020-11-29 DIAGNOSIS — H0279 Other degenerative disorders of eyelid and periocular area: Secondary | ICD-10-CM | POA: Diagnosis not present

## 2020-11-29 DIAGNOSIS — H02835 Dermatochalasis of left lower eyelid: Secondary | ICD-10-CM | POA: Diagnosis not present

## 2020-11-29 DIAGNOSIS — H53483 Generalized contraction of visual field, bilateral: Secondary | ICD-10-CM | POA: Diagnosis not present

## 2020-11-29 DIAGNOSIS — H02832 Dermatochalasis of right lower eyelid: Secondary | ICD-10-CM | POA: Diagnosis not present

## 2020-11-29 DIAGNOSIS — H02831 Dermatochalasis of right upper eyelid: Secondary | ICD-10-CM | POA: Diagnosis not present

## 2020-11-29 DIAGNOSIS — H02413 Mechanical ptosis of bilateral eyelids: Secondary | ICD-10-CM | POA: Diagnosis not present

## 2020-11-29 DIAGNOSIS — H57813 Brow ptosis, bilateral: Secondary | ICD-10-CM | POA: Diagnosis not present

## 2020-11-29 DIAGNOSIS — H02423 Myogenic ptosis of bilateral eyelids: Secondary | ICD-10-CM | POA: Diagnosis not present

## 2020-11-29 MED ORDER — HYDROCODONE-ACETAMINOPHEN 5-325 MG PO TABS
1.0000 | ORAL_TABLET | Freq: Four times a day (QID) | ORAL | 0 refills | Status: DC
  Filled 2020-11-29: qty 12, 3d supply, fill #0

## 2020-12-12 ENCOUNTER — Other Ambulatory Visit (HOSPITAL_COMMUNITY): Payer: Self-pay

## 2020-12-12 MED ORDER — PREDNISOLONE ACETATE 1 % OP SUSP
1.0000 [drp] | Freq: Two times a day (BID) | OPHTHALMIC | 0 refills | Status: DC
  Filled 2020-12-12: qty 5, 25d supply, fill #0

## 2021-01-03 ENCOUNTER — Other Ambulatory Visit (HOSPITAL_COMMUNITY): Payer: Self-pay

## 2021-01-03 DIAGNOSIS — Z01419 Encounter for gynecological examination (general) (routine) without abnormal findings: Secondary | ICD-10-CM | POA: Diagnosis not present

## 2021-01-03 DIAGNOSIS — Z1231 Encounter for screening mammogram for malignant neoplasm of breast: Secondary | ICD-10-CM | POA: Diagnosis not present

## 2021-01-03 MED ORDER — ALPRAZOLAM 0.5 MG PO TABS
0.5000 mg | ORAL_TABLET | Freq: Three times a day (TID) | ORAL | 3 refills | Status: DC | PRN
  Filled 2021-01-03: qty 30, 10d supply, fill #0

## 2021-01-06 ENCOUNTER — Other Ambulatory Visit (HOSPITAL_COMMUNITY): Payer: Self-pay

## 2021-04-07 ENCOUNTER — Other Ambulatory Visit (HOSPITAL_COMMUNITY): Payer: Self-pay

## 2021-05-27 DIAGNOSIS — H35371 Puckering of macula, right eye: Secondary | ICD-10-CM | POA: Diagnosis not present

## 2021-05-27 DIAGNOSIS — H524 Presbyopia: Secondary | ICD-10-CM | POA: Diagnosis not present

## 2021-07-04 ENCOUNTER — Other Ambulatory Visit (HOSPITAL_COMMUNITY): Payer: Self-pay

## 2021-07-07 ENCOUNTER — Other Ambulatory Visit (HOSPITAL_COMMUNITY): Payer: Self-pay

## 2021-08-13 DIAGNOSIS — D225 Melanocytic nevi of trunk: Secondary | ICD-10-CM | POA: Diagnosis not present

## 2021-08-13 DIAGNOSIS — D2261 Melanocytic nevi of right upper limb, including shoulder: Secondary | ICD-10-CM | POA: Diagnosis not present

## 2021-08-13 DIAGNOSIS — D2272 Melanocytic nevi of left lower limb, including hip: Secondary | ICD-10-CM | POA: Diagnosis not present

## 2021-08-13 DIAGNOSIS — L821 Other seborrheic keratosis: Secondary | ICD-10-CM | POA: Diagnosis not present

## 2021-08-13 DIAGNOSIS — D2271 Melanocytic nevi of right lower limb, including hip: Secondary | ICD-10-CM | POA: Diagnosis not present

## 2021-08-13 DIAGNOSIS — L814 Other melanin hyperpigmentation: Secondary | ICD-10-CM | POA: Diagnosis not present

## 2021-08-13 DIAGNOSIS — D485 Neoplasm of uncertain behavior of skin: Secondary | ICD-10-CM | POA: Diagnosis not present

## 2021-08-13 DIAGNOSIS — D0361 Melanoma in situ of right upper limb, including shoulder: Secondary | ICD-10-CM | POA: Diagnosis not present

## 2021-08-13 DIAGNOSIS — D2262 Melanocytic nevi of left upper limb, including shoulder: Secondary | ICD-10-CM | POA: Diagnosis not present

## 2021-09-01 ENCOUNTER — Other Ambulatory Visit (HOSPITAL_COMMUNITY): Payer: Self-pay

## 2021-09-01 DIAGNOSIS — L988 Other specified disorders of the skin and subcutaneous tissue: Secondary | ICD-10-CM | POA: Diagnosis not present

## 2021-09-01 DIAGNOSIS — D0361 Melanoma in situ of right upper limb, including shoulder: Secondary | ICD-10-CM | POA: Diagnosis not present

## 2021-09-01 MED ORDER — MUPIROCIN 2 % EX OINT
TOPICAL_OINTMENT | Freq: Every day | CUTANEOUS | 0 refills | Status: DC
  Filled 2021-09-01: qty 22, 15d supply, fill #0

## 2021-09-25 ENCOUNTER — Other Ambulatory Visit (HOSPITAL_COMMUNITY): Payer: Self-pay

## 2021-11-27 ENCOUNTER — Other Ambulatory Visit (HOSPITAL_COMMUNITY): Payer: Self-pay

## 2021-11-27 DIAGNOSIS — Z789 Other specified health status: Secondary | ICD-10-CM | POA: Diagnosis not present

## 2021-11-27 DIAGNOSIS — Z0000 Encounter for general adult medical examination without abnormal findings: Secondary | ICD-10-CM | POA: Diagnosis not present

## 2021-11-27 DIAGNOSIS — R748 Abnormal levels of other serum enzymes: Secondary | ICD-10-CM | POA: Diagnosis not present

## 2021-11-27 DIAGNOSIS — E7800 Pure hypercholesterolemia, unspecified: Secondary | ICD-10-CM | POA: Diagnosis not present

## 2021-11-27 MED ORDER — FENOFIBRATE 160 MG PO TABS
160.0000 mg | ORAL_TABLET | Freq: Every day | ORAL | 4 refills | Status: AC
  Filled 2021-11-27 – 2021-12-26 (×2): qty 90, 90d supply, fill #0
  Filled 2022-03-28: qty 90, 90d supply, fill #1
  Filled 2022-06-29: qty 90, 90d supply, fill #2
  Filled 2022-09-24: qty 90, 90d supply, fill #3

## 2021-12-26 ENCOUNTER — Other Ambulatory Visit (HOSPITAL_COMMUNITY): Payer: Self-pay

## 2022-01-05 DIAGNOSIS — Z1231 Encounter for screening mammogram for malignant neoplasm of breast: Secondary | ICD-10-CM | POA: Diagnosis not present

## 2022-01-05 DIAGNOSIS — Z01419 Encounter for gynecological examination (general) (routine) without abnormal findings: Secondary | ICD-10-CM | POA: Diagnosis not present

## 2022-03-11 DIAGNOSIS — D225 Melanocytic nevi of trunk: Secondary | ICD-10-CM | POA: Diagnosis not present

## 2022-03-11 DIAGNOSIS — D2262 Melanocytic nevi of left upper limb, including shoulder: Secondary | ICD-10-CM | POA: Diagnosis not present

## 2022-03-11 DIAGNOSIS — D2272 Melanocytic nevi of left lower limb, including hip: Secondary | ICD-10-CM | POA: Diagnosis not present

## 2022-03-11 DIAGNOSIS — L814 Other melanin hyperpigmentation: Secondary | ICD-10-CM | POA: Diagnosis not present

## 2022-03-11 DIAGNOSIS — D2261 Melanocytic nevi of right upper limb, including shoulder: Secondary | ICD-10-CM | POA: Diagnosis not present

## 2022-03-11 DIAGNOSIS — Z85820 Personal history of malignant melanoma of skin: Secondary | ICD-10-CM | POA: Diagnosis not present

## 2022-03-11 DIAGNOSIS — L821 Other seborrheic keratosis: Secondary | ICD-10-CM | POA: Diagnosis not present

## 2022-03-11 DIAGNOSIS — D2271 Melanocytic nevi of right lower limb, including hip: Secondary | ICD-10-CM | POA: Diagnosis not present

## 2022-06-09 DIAGNOSIS — H35371 Puckering of macula, right eye: Secondary | ICD-10-CM | POA: Diagnosis not present

## 2022-06-09 DIAGNOSIS — H524 Presbyopia: Secondary | ICD-10-CM | POA: Diagnosis not present

## 2022-06-30 ENCOUNTER — Other Ambulatory Visit (HOSPITAL_COMMUNITY): Payer: Self-pay

## 2022-07-14 DIAGNOSIS — H35371 Puckering of macula, right eye: Secondary | ICD-10-CM | POA: Diagnosis not present

## 2022-08-17 ENCOUNTER — Other Ambulatory Visit (HOSPITAL_COMMUNITY): Payer: Self-pay

## 2022-11-05 DIAGNOSIS — D2272 Melanocytic nevi of left lower limb, including hip: Secondary | ICD-10-CM | POA: Diagnosis not present

## 2022-11-05 DIAGNOSIS — L821 Other seborrheic keratosis: Secondary | ICD-10-CM | POA: Diagnosis not present

## 2022-11-05 DIAGNOSIS — D485 Neoplasm of uncertain behavior of skin: Secondary | ICD-10-CM | POA: Diagnosis not present

## 2022-11-05 DIAGNOSIS — D2271 Melanocytic nevi of right lower limb, including hip: Secondary | ICD-10-CM | POA: Diagnosis not present

## 2022-11-05 DIAGNOSIS — L814 Other melanin hyperpigmentation: Secondary | ICD-10-CM | POA: Diagnosis not present

## 2022-11-05 DIAGNOSIS — D225 Melanocytic nevi of trunk: Secondary | ICD-10-CM | POA: Diagnosis not present

## 2022-11-05 DIAGNOSIS — Z85820 Personal history of malignant melanoma of skin: Secondary | ICD-10-CM | POA: Diagnosis not present

## 2022-11-05 DIAGNOSIS — D2262 Melanocytic nevi of left upper limb, including shoulder: Secondary | ICD-10-CM | POA: Diagnosis not present

## 2023-01-14 ENCOUNTER — Other Ambulatory Visit: Payer: Self-pay | Admitting: Obstetrics and Gynecology

## 2023-01-14 DIAGNOSIS — E2839 Other primary ovarian failure: Secondary | ICD-10-CM

## 2023-05-04 ENCOUNTER — Telehealth: Payer: Medicare Other | Admitting: Physician Assistant

## 2023-05-04 DIAGNOSIS — J069 Acute upper respiratory infection, unspecified: Secondary | ICD-10-CM

## 2023-05-04 MED ORDER — BENZONATATE 100 MG PO CAPS: 100.0000 mg | ORAL_CAPSULE | Freq: Three times a day (TID) | ORAL | 0 refills | Status: AC | PRN

## 2023-05-04 MED ORDER — FLUTICASONE PROPIONATE 50 MCG/ACT NA SUSP: 2.0000 | Freq: Every day | NASAL | 0 refills | Status: AC

## 2023-05-04 NOTE — Progress Notes (Signed)
 I have spent 5 minutes in review of e-visit questionnaire, review and updating patient chart, medical decision making and response to patient.   Piedad Climes, PA-C

## 2023-05-04 NOTE — Progress Notes (Signed)

## 2023-09-23 ENCOUNTER — Ambulatory Visit (HOSPITAL_BASED_OUTPATIENT_CLINIC_OR_DEPARTMENT_OTHER)
Admission: RE | Admit: 2023-09-23 | Discharge: 2023-09-23 | Disposition: A | Discharge status: Home / Self Care | Source: Ambulatory Visit | Attending: Obstetrics and Gynecology | Admitting: Obstetrics and Gynecology

## 2023-09-23 ENCOUNTER — Other Ambulatory Visit: Payer: Commercial Managed Care - PPO

## 2023-09-23 DIAGNOSIS — E2839 Other primary ovarian failure: Secondary | ICD-10-CM | POA: Diagnosis present
